# Patient Record
Sex: Male | Born: 1995 | Race: White | Hispanic: No | Marital: Single | Smoking: Never smoker
Health system: Southern US, Community
[De-identification: ages and names within clinical notes are randomized; demographics above are authoritative.]

## PROBLEM LIST (undated history)

## (undated) DIAGNOSIS — G43909 Migraine, unspecified, not intractable, without status migrainosus: Secondary | ICD-10-CM

## (undated) HISTORY — PX: EYE SURGERY: SHX253

## (undated) HISTORY — PX: CIRCUMCISION: SUR203

---
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
## 2013-09-17 ENCOUNTER — Telehealth: Payer: Self-pay | Admitting: *Deleted

## 2013-09-17 NOTE — Telephone Encounter (Signed)
Tyress called and stated that he needs a letter from Dr. Sharene Skeans stating that he doesn't have any neurological issues that he's going to die from and that the weakness that he was experiencing in his hands has resolved, the patient is trying to get this letter in hopes of joining the [employer] in Jan. 2015. Dr. Sharene Skeans last saw this patient 02/11/11 and he had EMG/NCV studies done on 02/28/11, the cause for his weakness was not found and Dr. Sharene Skeans at that time recommended to the patient's mother that they return the Efstathios's PCP. I informed the patient upon speaking to him today that I would send a message to Dr. Sharene Skeans in regards to his request however it's been two years since Dr. Sharene Skeans saw him and he could not write a letter about his current state of health without examining him. Patient agreed with this plan and will await a phone call with a decision.         Thanks,  Belenda Cruise.

## 2013-09-17 NOTE — Telephone Encounter (Signed)
We will work him in and assess him.  You are correct I cannot write a letter concerning a manner that is 69-1/19 years old as a letter concerning his health at this time.

## 2013-09-18 NOTE — Telephone Encounter (Signed)
I left a message on the patient's voicemail asking for a return call. MB

## 2013-09-20 NOTE — Telephone Encounter (Signed)
I left another message on [phone] asking for a return call. MB

## 2013-09-23 NOTE — Telephone Encounter (Signed)
I have left several messages for this patient to return my call, he has not returned the call, should he return the call at a later date we will schedule him to be seen by Dr. Sharene Skeans. Dr. Sharene Skeans gave the okay that he would see the patient. MB

## 2015-09-21 ENCOUNTER — Ambulatory Visit: Payer: Worker's Compensation

## 2015-09-21 ENCOUNTER — Ambulatory Visit (INDEPENDENT_AMBULATORY_CARE_PROVIDER_SITE_OTHER): Payer: Worker's Compensation | Admitting: Family Medicine

## 2015-09-21 VITALS — BP 114/68 | HR 93 | Temp 97.9°F | Resp 18 | Ht 69.5 in | Wt 135.0 lb

## 2015-09-21 DIAGNOSIS — M79675 Pain in left toe(s): Secondary | ICD-10-CM | POA: Diagnosis not present

## 2015-09-21 DIAGNOSIS — M25572 Pain in left ankle and joints of left foot: Secondary | ICD-10-CM

## 2015-09-21 MED ORDER — IBUPROFEN 800 MG PO TABS: ORAL_TABLET | ORAL | Status: AC

## 2015-09-21 NOTE — Progress Notes (Signed)
   09/21/2015 12:39 PM   DOB: Jan 28, 1996 / MRN: [id number]  SUBJECTIVE:  Frederick Floyd is a 19 y.o. male presenting for left 5th digit toe pain that started after "pushing a loaded cart with wheels" over his toe.  Reports he was trying to stop the cart and his manager was continuing to push.  Reports pain and swelling of the affected area.  He is unable to move to toe.  Denies any bleeding. Pain is worse with walking.    Review of Systems  Constitutional: Negative for fever and chills.  Eyes: Negative for blurred vision.  Respiratory: Negative for cough and shortness of breath.   Cardiovascular: Negative for chest pain.  Gastrointestinal: Negative for nausea and abdominal pain.  Genitourinary: Negative for dysuria, urgency and frequency.  Musculoskeletal: Positive for joint pain. Negative for myalgias and falls.  Skin: Negative for rash.  Neurological: Negative for dizziness, tingling and headaches.  Psychiatric/Behavioral: Negative for depression. The patient is not nervous/anxious.     Problem list, family history, surgical history, medical history and medications reviewed and updated by myself where necessary, and exist elsewhere in the encounter.   OBJECTIVE:  BP 114/68 mmHg  Pulse 93  Temp(Src) 97.9 F (36.6 C) (Oral)  Resp 18  Ht 5' 9.5" (1.765 m)  Wt 135 lb (61.236 kg)  BMI 19.66 kg/m2  SpO2 96% CrCl cannot be calculated (Patient has no serum creatinine result on file.).  Physical Exam  Constitutional: He is oriented to person, place, and time. He appears well-developed. He does not appear ill.  Eyes: Conjunctivae and EOM are normal. Pupils are equal, round, and reactive to light.  Cardiovascular: Normal rate.   Pulmonary/Chest: Effort normal.  Abdominal: He exhibits no distension.  Musculoskeletal: Normal range of motion.       Feet:  Neurological: He is alert and oriented to person, place, and time. No cranial nerve deficit. Coordination normal.  Skin: Skin  is warm and dry. He is not diaphoretic.  Psychiatric: He has a normal mood and affect.  Nursing note and vitals reviewed.  [facility] reading (PRIMARY) by  Dr. Patsy [place]ager[place]: Negative for obvious fracture.    No results found for this or any previous visit (from the past 48 hour(s)).  ASSESSMENT AND PLAN  Cristal DeerChristopher was seen today for foot injury.  Diagnoses and all orders for this visit:  Pain in joint, ankle and foot, left: Radiograph reassuring.  This is most likely a soft tissue injury.  Advised 5 day course of Ibuprofen tid at minimum.  Toe buddy taped and patient provided with supplies. No need to follow up here unless his symptoms do not respond to conservative management.     Toe pain, left -     DG Toe 5th Left; Future   The patient was advised to call or return to clinic if he does not see an improvement in symptoms or to seek the care of the closest emergency department if he worsens with the above plan.   Deliah BostonMichael Tyrisha Benninger, MHS, PA-C [facility] 09/21/2015 12:39 PM

## 2017-05-26 IMAGING — CR DG TOE 5TH 2+V*L*
1 series · 1 of 1 positions shown · non-contrast
Comparison: None.

CLINICAL DATA: Pain.  No history of trauma

EXAM:
DG TOE 5TH LEFT

[AP]
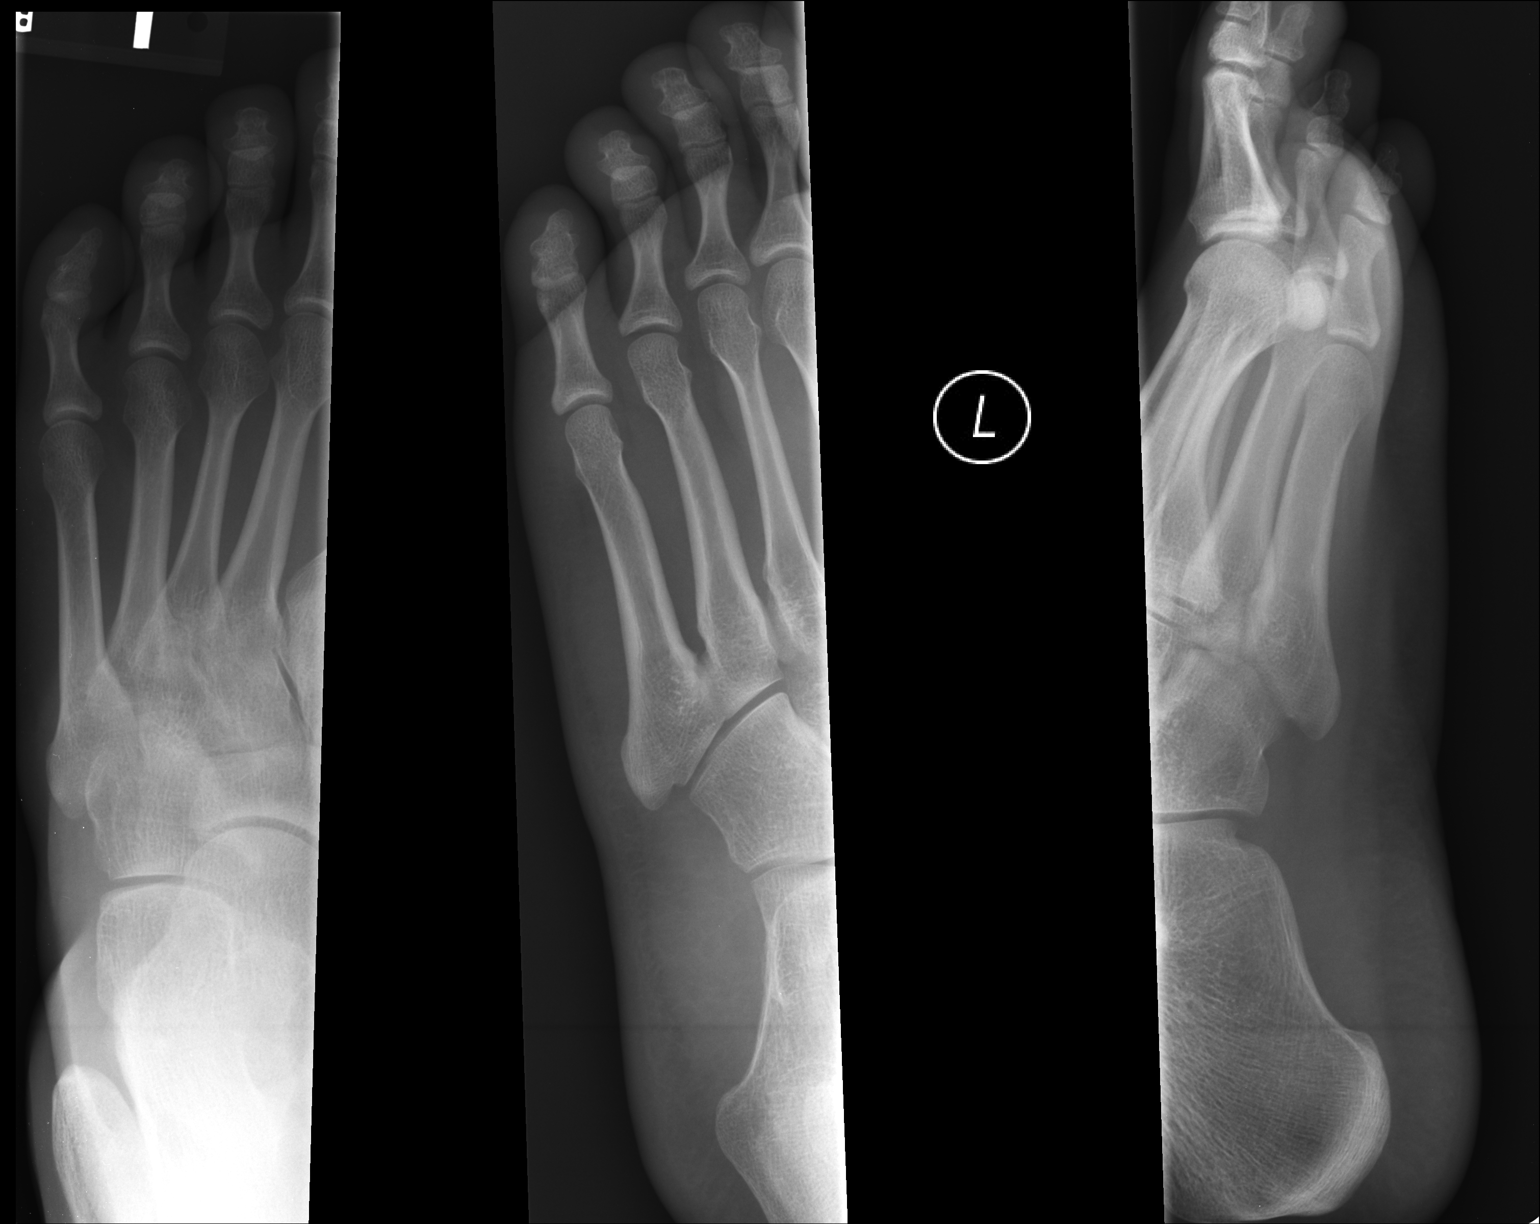

[1 of 1 positions shown; findings below may reference images not displayed]

FINDINGS: Frontal, oblique, and lateral views were obtained. No fracture or
dislocation apparent. Joint spaces appear intact. No erosive change.
IMPRESSION: No fracture or dislocation.  No apparent arthropathy.
